# Patient Record
Sex: Female | Born: 1980 | Race: White | Hispanic: No | Marital: Single | Smoking: Current every day smoker
Health system: Southern US, Community
[De-identification: ages and names within clinical notes are randomized; demographics above are authoritative.]

## PROBLEM LIST (undated history)

## (undated) ENCOUNTER — Inpatient Hospital Stay (HOSPITAL_COMMUNITY): Payer: Self-pay

## (undated) DIAGNOSIS — I739 Peripheral vascular disease, unspecified: Secondary | ICD-10-CM

## (undated) DIAGNOSIS — B009 Herpesviral infection, unspecified: Secondary | ICD-10-CM

## (undated) DIAGNOSIS — Q279 Congenital malformation of peripheral vascular system, unspecified: Secondary | ICD-10-CM

## (undated) DIAGNOSIS — R87629 Unspecified abnormal cytological findings in specimens from vagina: Secondary | ICD-10-CM

## (undated) HISTORY — PX: APPENDECTOMY: SHX54

## (undated) HISTORY — PX: BREAST FIBROADENOMA SURGERY: SHX580

---
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
## 2017-09-23 ENCOUNTER — Encounter: Payer: Self-pay | Admitting: Family Medicine

## 2017-09-23 ENCOUNTER — Ambulatory Visit (INDEPENDENT_AMBULATORY_CARE_PROVIDER_SITE_OTHER): Payer: Self-pay | Admitting: *Deleted

## 2017-09-23 DIAGNOSIS — Z3201 Encounter for pregnancy test, result positive: Secondary | ICD-10-CM

## 2017-09-23 DIAGNOSIS — Z3200 Encounter for pregnancy test, result unknown: Secondary | ICD-10-CM

## 2017-09-23 LAB — POCT PREGNANCY, URINE: Preg Test, Ur: POSITIVE — AB

## 2017-09-23 MED ORDER — PRENATAL VITAMINS 0.8 MG PO TABS: 1.0000 | ORAL_TABLET | Freq: Every day | ORAL | 12 refills | Status: AC

## 2017-09-23 NOTE — Progress Notes (Signed)
Pt informed of +UPT today.  LMP 04/07/17.  EDD 01/12/18.  Medication reconciliation completed.  Pt is a new resident @ [facility]. She requests PNV Rx - sent.

## 2017-09-23 NOTE — Progress Notes (Signed)
Chart reviewed for nurse visit. Agree with plan of care.   Judeth HornLawrence, Murle Hellstrom, NP 09/23/2017 1:10 PM

## 2017-09-27 ENCOUNTER — Other Ambulatory Visit: Payer: Self-pay

## 2017-09-27 ENCOUNTER — Encounter (HOSPITAL_COMMUNITY): Payer: Self-pay | Admitting: *Deleted

## 2017-09-27 ENCOUNTER — Inpatient Hospital Stay (HOSPITAL_COMMUNITY)
Admission: AD | Admit: 2017-09-27 | Discharge: 2017-09-27 | Disposition: A | Discharge status: Home / Self Care | Payer: Medicaid Other | Source: Ambulatory Visit | Attending: Family Medicine | Admitting: Family Medicine

## 2017-09-27 DIAGNOSIS — Z87891 Personal history of nicotine dependence: Secondary | ICD-10-CM | POA: Diagnosis not present

## 2017-09-27 DIAGNOSIS — O26892 Other specified pregnancy related conditions, second trimester: Secondary | ICD-10-CM | POA: Insufficient documentation

## 2017-09-27 DIAGNOSIS — T148XXA Other injury of unspecified body region, initial encounter: Secondary | ICD-10-CM | POA: Diagnosis not present

## 2017-09-27 DIAGNOSIS — Z3A26 26 weeks gestation of pregnancy: Secondary | ICD-10-CM | POA: Diagnosis not present

## 2017-09-27 DIAGNOSIS — R109 Unspecified abdominal pain: Secondary | ICD-10-CM

## 2017-09-27 LAB — URINALYSIS, ROUTINE W REFLEX MICROSCOPIC
BACTERIA UA: NONE SEEN
Bilirubin Urine: NEGATIVE
Glucose, UA: NEGATIVE mg/dL
HGB URINE DIPSTICK: NEGATIVE
Ketones, ur: NEGATIVE mg/dL
LEUKOCYTES UA: NEGATIVE
NITRITE: NEGATIVE
PH: 6 (ref 5.0–8.0)
Protein, ur: NEGATIVE mg/dL
SPECIFIC GRAVITY, URINE: 1.018 (ref 1.005–1.030)

## 2017-09-27 LAB — RAPID URINE DRUG SCREEN, HOSP PERFORMED
AMPHETAMINES: NOT DETECTED
BARBITURATES: NOT DETECTED
Benzodiazepines: NOT DETECTED
Cocaine: NOT DETECTED
OPIATES: NOT DETECTED
TETRAHYDROCANNABINOL: NOT DETECTED

## 2017-09-27 MED ORDER — KETOROLAC TROMETHAMINE 60 MG/2ML IM SOLN
60.0000 mg | Freq: Once | INTRAMUSCULAR | Status: DC
  Filled 2017-09-27: qty 2

## 2017-09-27 MED ORDER — CYCLOBENZAPRINE HCL 10 MG PO TABS: 10.0000 mg | ORAL_TABLET | Freq: Three times a day (TID) | ORAL | 0 refills | Status: DC | PRN

## 2017-09-27 NOTE — MAU Note (Addendum)
Around 5 PM the pt reports pain that started suddenly. Pt decribes pain as a "tearing sensation" and then her "pelvis felt like it was on fire"

## 2017-09-27 NOTE — Discharge Instructions (Signed)
Abdominal Pain During Pregnancy Abdominal pain is common in pregnancy. Most of the time, it does not cause harm. There are many causes of abdominal pain. Some causes are more serious than others and sometimes the cause is not known. Abdominal pain can be a sign that something is very wrong with the pregnancy or the pain may have nothing to do with the pregnancy. Always tell your health care provider if you have any abdominal pain. Follow these instructions at home:  Do not have sex or put anything in your vagina until your symptoms go away completely.  Watch your abdominal pain for any changes.  Get plenty of rest until your pain improves.  Drink enough fluid to keep your urine clear or pale yellow.  Take over-the-counter or prescription medicines only as told by your health care provider.  Keep all follow-up visits as told by your health care provider. This is important. Contact a health care provider if:  You have a fever.  Your pain gets worse or you have cramping.  Your pain continues after resting. Get help right away if:  You are bleeding, leaking fluid, or passing tissue from the vagina.  You have vomiting or diarrhea that does not go away.  You have painful or bloody urination.  You notice a decrease in your baby's movements.  You feel very weak or faint.  You have shortness of breath.  You develop a severe headache with abdominal pain.  You have abnormal vaginal discharge with abdominal pain. This information is not intended to replace advice given to you by your health care provider. Make sure you discuss any questions you have with your health care provider. Document Released: 01/28/2005 Document Revised: 11/09/2015 Document Reviewed: 08/27/2012 Elsevier Interactive Patient Education  2018 Elsevier Inc.  Muscle Strain A muscle strain (pulled muscle) happens when a muscle is stretched beyond normal length. It happens when a sudden, violent force stretches your  muscle too far. Usually, a few of the fibers in your muscle are torn. Muscle strain is common in athletes. Recovery usually takes 1-2 weeks. Complete healing takes 5-6 weeks. Follow these instructions at home:  Follow the PRICE method of treatment to help your injury get better. Do this the first 2-3 days after the injury: ? Protect. Protect the muscle to keep it from getting injured again. ? Rest. Limit your activity and rest the injured body part. ? Ice. Put ice in a plastic bag. Place a towel between your skin and the bag. Then, apply the ice and leave it on from 15-20 minutes each hour. After the third day, switch to moist heat packs. ? Compression. Use a splint or elastic bandage on the injured area for comfort. Do not put it on too tightly. ? Elevate. Keep the injured body part above the level of your heart.  Only take medicine as told by your doctor.  Warm up before doing exercise to prevent future muscle strains. Contact a doctor if:  You have more pain or puffiness (swelling) in the injured area.  You feel numbness, tingling, or notice a loss of strength in the injured area. This information is not intended to replace advice given to you by your health care provider. Make sure you discuss any questions you have with your health care provider. Document Released: 11/07/2007 Document Revised: 07/06/2015 Document Reviewed: 08/27/2012 Elsevier Interactive Patient Education  2017 [employer]

## 2017-09-27 NOTE — MAU Provider Note (Signed)
History   G3 P1-1-0-2 at 26 weeks and 2 days per LMP and with acute left-sided tearing pain that started at 5 PM.  She denies vaginal bleeding or rupture membranes.  Patient said she had an ultrasound done at at [facility] [place] [facility] [place] and was told she has an EDC of 01/01/2018.  Patient has not had prenatal care this pregnancy.  Patient states pain is a tearing pain she feels like going through her colon.  CSN: [id number]  Arrival date & time 09/27/17  1919   None     Chief Complaint  Patient presents with  . Rectal Pain  . Abdominal Pain    HPI  Past Medical History:  Diagnosis Date  . Medical history non-contributory     Past Surgical History:  Procedure Laterality Date  . APPENDECTOMY      No family history on file.  Social History   Tobacco Use  . Smoking status: Former Smoker    Last attempt to quit: 08/27/2017    Years since quitting: 0.0  . Smokeless tobacco: Never Used  Substance Use Topics  . Alcohol use: Never    Frequency: Never  . Drug use: Never    OB History    Gravida  3   Para  2   Term      Preterm  2   AB      Living  2     SAB      TAB      Ectopic      Multiple      Live Births  2           Review of Systems  Constitutional: Negative.   HENT: Negative.   Eyes: Negative.   Respiratory: Negative.   Cardiovascular: Negative.   Gastrointestinal: Positive for abdominal pain.  Endocrine: Negative.   Genitourinary: Negative.   Musculoskeletal: Negative.   Skin: Negative.   Allergic/Immunologic: Negative.   Neurological: Negative.   Hematological: Negative.   Psychiatric/Behavioral: Negative.     Allergies  Patient has no known allergies.  Home Medications    BP 114/66 (BP Location: Right Arm)   Pulse (!) 109   Temp 98.1 F (36.7 C)   Resp 20   Ht 5\' 6"  (1.676 m)   Wt 70.8 kg   BMI 25.18 kg/m   Physical Exam  Constitutional: She is oriented to person, place, and time. She  appears well-developed and well-nourished.  HENT:  Head: Normocephalic and atraumatic.  Cardiovascular: Normal rate, regular rhythm and normal heart sounds.  Pulmonary/Chest: Effort normal and breath sounds normal.  Abdominal: Soft. Normal appearance. Bowel sounds are increased.  Genitourinary: Rectum normal, vagina normal and uterus normal.  Neurological: She is alert and oriented to person, place, and time.  Skin: Skin is warm and dry.  Psychiatric: She has a normal mood and affect. Her behavior is normal.    MAU Course  Procedures (including critical care time)  Labs Reviewed  URINALYSIS, ROUTINE W REFLEX MICROSCOPIC  RAPID URINE DRUG SCREEN, HOSP PERFORMED  HEPATITIS B SURFACE ANTIGEN  RUBELLA SCREEN  RPR  CBC  DIFFERENTIAL  HIV ANTIBODY (ROUTINE TESTING)  HEMOGLOBIN A1C  TYPE AND SCREEN   No results found.   No diagnosis found.    MDM  Vital signs are stable.  Fetal heart rate is 150s moderate variability no decelerations.  There are no uterine contractions noted.  Abdomen is soft and nontender.  Sterile vaginal exam was performed cervix  is fine thick posterior and high. Will d/c home with rx for flexiril

## 2017-09-27 NOTE — MAU Note (Addendum)
Sudden onset tearing pain in lower abd that went to rectum and lower back. No vag bleeding or d/c. Normal BM today. Pt lives in maternity home here and has had care in [place]. Has appt downstairs in clinic 10/07/17

## 2017-10-07 ENCOUNTER — Encounter: Payer: Self-pay | Admitting: Family Medicine

## 2017-10-07 ENCOUNTER — Ambulatory Visit (INDEPENDENT_AMBULATORY_CARE_PROVIDER_SITE_OTHER): Payer: Medicaid Other | Admitting: Advanced Practice Midwife

## 2017-10-07 ENCOUNTER — Ambulatory Visit (INDEPENDENT_AMBULATORY_CARE_PROVIDER_SITE_OTHER): Payer: Medicaid Other | Admitting: Clinical

## 2017-10-07 ENCOUNTER — Other Ambulatory Visit: Payer: Self-pay | Admitting: Advanced Practice Midwife

## 2017-10-07 VITALS — BP 109/69 | HR 93 | Wt 157.2 lb

## 2017-10-07 DIAGNOSIS — Z3482 Encounter for supervision of other normal pregnancy, second trimester: Secondary | ICD-10-CM | POA: Diagnosis present

## 2017-10-07 DIAGNOSIS — Z609 Problem related to social environment, unspecified: Secondary | ICD-10-CM

## 2017-10-07 DIAGNOSIS — Z3480 Encounter for supervision of other normal pregnancy, unspecified trimester: Secondary | ICD-10-CM | POA: Insufficient documentation

## 2017-10-07 DIAGNOSIS — R102 Pelvic and perineal pain: Secondary | ICD-10-CM | POA: Diagnosis not present

## 2017-10-07 DIAGNOSIS — R12 Heartburn: Secondary | ICD-10-CM

## 2017-10-07 DIAGNOSIS — O26899 Other specified pregnancy related conditions, unspecified trimester: Secondary | ICD-10-CM

## 2017-10-07 DIAGNOSIS — O26893 Other specified pregnancy related conditions, third trimester: Secondary | ICD-10-CM

## 2017-10-07 DIAGNOSIS — Q279 Congenital malformation of peripheral vascular system, unspecified: Secondary | ICD-10-CM

## 2017-10-07 DIAGNOSIS — Z23 Encounter for immunization: Secondary | ICD-10-CM | POA: Diagnosis not present

## 2017-10-07 DIAGNOSIS — I739 Peripheral vascular disease, unspecified: Secondary | ICD-10-CM | POA: Insufficient documentation

## 2017-10-07 LAB — POCT URINALYSIS DIP (DEVICE)
Bilirubin Urine: NEGATIVE
Glucose, UA: NEGATIVE mg/dL
HGB URINE DIPSTICK: NEGATIVE
Ketones, ur: NEGATIVE mg/dL
NITRITE: NEGATIVE
PH: 7.5 (ref 5.0–8.0)
Protein, ur: NEGATIVE mg/dL
Specific Gravity, Urine: 1.015 (ref 1.005–1.030)
UROBILINOGEN UA: 0.2 mg/dL (ref 0.0–1.0)

## 2017-10-07 MED ORDER — TRAMADOL HCL 50 MG PO TABS: 50.0000 mg | ORAL_TABLET | Freq: Four times a day (QID) | ORAL | 0 refills | Status: DC | PRN

## 2017-10-07 MED ORDER — RANITIDINE HCL 150 MG PO TABS: 150.0000 mg | ORAL_TABLET | Freq: Two times a day (BID) | ORAL | 5 refills | Status: DC

## 2017-10-07 MED ORDER — COMFORT FIT MATERNITY SUPP MED MISC
1.0000 | Freq: Every day | 0 refills | Status: AC
Start: 2017-10-07 — End: ?

## 2017-10-07 NOTE — Patient Instructions (Signed)
Third Trimester of Pregnancy The third trimester is from week 28 through week 40 (months 7 through 9). The third trimester is a time when the unborn baby (fetus) is growing rapidly. At the end of the ninth month, the fetus is about 20 inches in length and weighs 6-10 pounds. Body changes during your third trimester Your body will continue to go through many changes during pregnancy. The changes vary from woman to woman. During the third trimester:  Your weight will continue to increase. You can expect to gain 25-35 pounds (11-16 kg) by the end of the pregnancy.  You may begin to get stretch marks on your hips, abdomen, and breasts.  You may urinate more often because the fetus is moving lower into your pelvis and pressing on your bladder.  You may develop or continue to have heartburn. This is caused by increased hormones that slow down muscles in the digestive tract.  You may develop or continue to have constipation because increased hormones slow digestion and cause the muscles that push waste through your intestines to relax.  You may develop hemorrhoids. These are swollen veins (varicose veins) in the rectum that can itch or be painful.  You may develop swollen, bulging veins (varicose veins) in your legs.  You may have increased body aches in the pelvis, back, or thighs. This is due to weight gain and increased hormones that are relaxing your joints.  You may have changes in your hair. These can include thickening of your hair, rapid growth, and changes in texture. Some women also have hair loss during or after pregnancy, or hair that feels dry or thin. Your hair will most likely return to normal after your baby is born.  Your breasts will continue to grow and they will continue to become tender. A yellow fluid (colostrum) may leak from your breasts. This is the first milk you are producing for your baby.  Your belly button may stick out.  You may notice more swelling in your hands,  face, or ankles.  You may have increased tingling or numbness in your hands, arms, and legs. The skin on your belly may also feel numb.  You may feel short of breath because of your expanding uterus.  You may have more problems sleeping. This can be caused by the size of your belly, increased need to urinate, and an increase in your body's metabolism.  You may notice the fetus "dropping," or moving lower in your abdomen (lightening).  You may have increased vaginal discharge.  You may notice your joints feel loose and you may have pain around your pelvic bone.  What to expect at prenatal visits You will have prenatal exams every 2 weeks until week 36. Then you will have weekly prenatal exams. During a routine prenatal visit:  You will be weighed to make sure you and the baby are growing normally.  Your blood pressure will be taken.  Your abdomen will be measured to track your baby's growth.  The fetal heartbeat will be listened to.  Any test results from the previous visit will be discussed.  You may have a cervical check near your due date to see if your cervix has softened or thinned (effaced).  You will be tested for Group B streptococcus. This happens between 35 and 37 weeks.  Your health care provider may ask you:  What your birth plan is.  How you are feeling.  If you are feeling the baby move.  If you have had   any abnormal symptoms, such as leaking fluid, bleeding, severe headaches, or abdominal cramping.  If you are using any tobacco products, including cigarettes, chewing tobacco, and electronic cigarettes.  If you have any questions.  Other tests or screenings that may be performed during your third trimester include:  Blood tests that check for low iron levels (anemia).  Fetal testing to check the health, activity level, and growth of the fetus. Testing is done if you have certain medical conditions or if there are problems during the  pregnancy.  Nonstress test (NST). This test checks the health of your baby to make sure there are no signs of problems, such as the baby not getting enough oxygen. During this test, a belt is placed around your belly. The baby is made to move, and its heart rate is monitored during movement.  What is false labor? False labor is a condition in which you feel small, irregular tightenings of the muscles in the womb (contractions) that usually go away with rest, changing position, or drinking water. These are called Braxton Hicks contractions. Contractions may last for hours, days, or even weeks before true labor sets in. If contractions come at regular intervals, become more frequent, increase in intensity, or become painful, you should see your health care provider. What are the signs of labor?  Abdominal cramps.  Regular contractions that start at 10 minutes apart and become stronger and more frequent with time.  Contractions that start on the top of the uterus and spread down to the lower abdomen and back.  Increased pelvic pressure and dull back pain.  A watery or bloody mucus discharge that comes from the vagina.  Leaking of amniotic fluid. This is also known as your "water breaking." It could be a slow trickle or a gush. Let your health care provider know if it has a color or strange odor. If you have any of these signs, call your health care provider right away, even if it is before your due date. Follow these instructions at home: Medicines  Follow your health care provider's instructions regarding medicine use. Specific medicines may be either safe or unsafe to take during pregnancy.  Take a prenatal vitamin that contains at least 600 micrograms (mcg) of folic acid.  If you develop constipation, try taking a stool softener if your health care provider approves. Eating and drinking  Eat a balanced diet that includes fresh fruits and vegetables, whole grains, good sources of protein  such as meat, eggs, or tofu, and low-fat dairy. Your health care provider will help you determine the amount of weight gain that is right for you.  Avoid raw meat and uncooked cheese. These carry germs that can cause birth defects in the baby.  If you have low calcium intake from food, talk to your health care provider about whether you should take a daily calcium supplement.  Eat four or five small meals rather than three large meals a day.  Limit foods that are high in fat and processed sugars, such as fried and sweet foods.  To prevent constipation: ? Drink enough fluid to keep your urine clear or pale yellow. ? Eat foods that are high in fiber, such as fresh fruits and vegetables, whole grains, and beans. Activity  Exercise only as directed by your health care provider. Most women can continue their usual exercise routine during pregnancy. Try to exercise for 30 minutes at least 5 days a week. Stop exercising if you experience uterine contractions.  Avoid heavy   lifting.  Do not exercise in extreme heat or humidity, or at high altitudes.  Wear low-heel, comfortable shoes.  Practice good posture.  You may continue to have sex unless your health care provider tells you otherwise. Relieving pain and discomfort  Take frequent breaks and rest with your legs elevated if you have leg cramps or low back pain.  Take warm sitz baths to soothe any pain or discomfort caused by hemorrhoids. Use hemorrhoid cream if your health care provider approves.  Wear a good support bra to prevent discomfort from breast tenderness.  If you develop varicose veins: ? Wear support pantyhose or compression stockings as told by your healthcare provider. ? Elevate your feet for 15 minutes, 3-4 times a day. Prenatal care  Write down your questions. Take them to your prenatal visits.  Keep all your prenatal visits as told by your health care provider. This is important. Safety  Wear your seat belt at  all times when driving.  Make a list of emergency phone numbers, including numbers for family, friends, the hospital, and police and fire departments. General instructions  Avoid cat litter boxes and soil used by cats. These carry germs that can cause birth defects in the baby. If you have a cat, ask someone to clean the litter box for you.  Do not travel far distances unless it is absolutely necessary and only with the approval of your health care provider.  Do not use hot tubs, steam rooms, or saunas.  Do not drink alcohol.  Do not use any products that contain nicotine or tobacco, such as cigarettes and e-cigarettes. If you need help quitting, ask your health care provider.  Do not use any medicinal herbs or unprescribed drugs. These chemicals affect the formation and growth of the baby.  Do not douche or use tampons or scented sanitary pads.  Do not cross your legs for long periods of time.  To prepare for the arrival of your baby: ? Take prenatal classes to understand, practice, and ask questions about labor and delivery. ? Make a trial run to the hospital. ? Visit the hospital and tour the maternity area. ? Arrange for maternity or paternity leave through employers. ? Arrange for family and friends to take care of pets while you are in the hospital. ? Purchase a rear-facing car seat and make sure you know how to install it in your car. ? Pack your hospital bag. ? Prepare the baby's nursery. Make sure to remove all pillows and stuffed animals from the baby's crib to prevent suffocation.  Visit your dentist if you have not gone during your pregnancy. Use a soft toothbrush to brush your teeth and be gentle when you floss. Contact a health care provider if:  You are unsure if you are in labor or if your water has broken.  You become dizzy.  You have mild pelvic cramps, pelvic pressure, or nagging pain in your abdominal area.  You have lower back pain.  You have persistent  nausea, vomiting, or diarrhea.  You have an unusual or bad smelling vaginal discharge.  You have pain when you urinate. Get help right away if:  Your water breaks before 37 weeks.  You have regular contractions less than 5 minutes apart before 37 weeks.  You have a fever.  You are leaking fluid from your vagina.  You have spotting or bleeding from your vagina.  You have severe abdominal pain or cramping.  You have rapid weight loss or weight gain.    You have shortness of breath with chest pain.  You notice sudden or extreme swelling of your face, hands, ankles, feet, or legs.  Your baby makes fewer than 10 movements in 2 hours.  You have severe headaches that do not go away when you take medicine.  You have vision changes. Summary  The third trimester is from week 28 through week 40, months 7 through 9. The third trimester is a time when the unborn baby (fetus) is growing rapidly.  During the third trimester, your discomfort may increase as you and your baby continue to gain weight. You may have abdominal, leg, and back pain, sleeping problems, and an increased need to urinate.  During the third trimester your breasts will keep growing and they will continue to become tender. A yellow fluid (colostrum) may leak from your breasts. This is the first milk you are producing for your baby.  False labor is a condition in which you feel small, irregular tightenings of the muscles in the womb (contractions) that eventually go away. These are called Braxton Hicks contractions. Contractions may last for hours, days, or even weeks before true labor sets in.  Signs of labor can include: abdominal cramps; regular contractions that start at 10 minutes apart and become stronger and more frequent with time; watery or bloody mucus discharge that comes from the vagina; increased pelvic pressure and dull back pain; and leaking of amniotic fluid. This information is not intended to replace advice  given to you by your health care provider. Make sure you discuss any questions you have with your health care provider. Document Released: 01/22/2001 Document Revised: 07/06/2015 Document Reviewed: 03/31/2012 Elsevier Interactive Patient Education  2017 Elsevier Inc.  
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
## 2017-10-07 NOTE — Progress Notes (Signed)
Rx for heartburn. States she's using Tums OTC.
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
## 2017-10-09 LAB — URINE CULTURE, OB REFLEX: Organism ID, Bacteria: NO GROWTH

## 2017-10-09 LAB — CULTURE, OB URINE

## 2017-10-10 ENCOUNTER — Encounter: Payer: Self-pay | Admitting: Advanced Practice Midwife

## 2017-10-17 ENCOUNTER — Encounter: Payer: Self-pay | Admitting: *Deleted

## 2017-10-17 LAB — OBSTETRIC PANEL, INCLUDING HIV
Antibody Screen: NEGATIVE
BASOS ABS: 0 10*3/uL (ref 0.0–0.2)
Basos: 0 %
EOS (ABSOLUTE): 0.1 10*3/uL (ref 0.0–0.4)
Eos: 2 %
HEP B S AG: NEGATIVE
HIV Screen 4th Generation wRfx: NONREACTIVE
Hematocrit: 35 % (ref 34.0–46.6)
Hemoglobin: 11.8 g/dL (ref 11.1–15.9)
IMMATURE GRANS (ABS): 0 10*3/uL (ref 0.0–0.1)
IMMATURE GRANULOCYTES: 0 %
LYMPHS ABS: 2.1 10*3/uL (ref 0.7–3.1)
LYMPHS: 22 %
MCH: 30.3 pg (ref 26.6–33.0)
MCHC: 33.7 g/dL (ref 31.5–35.7)
MCV: 90 fL (ref 79–97)
MONOS ABS: 0.6 10*3/uL (ref 0.1–0.9)
Monocytes: 6 %
NEUTROS PCT: 70 %
Neutrophils Absolute: 6.4 10*3/uL (ref 1.4–7.0)
Platelets: 264 10*3/uL (ref 150–450)
RBC: 3.9 x10E6/uL (ref 3.77–5.28)
RDW: 13.8 % (ref 12.3–15.4)
RPR: NONREACTIVE
Rh Factor: POSITIVE
Rubella Antibodies, IGG: 5.95 index (ref >0.99)
WBC: 9.3 10*3/uL (ref 3.4–10.8)

## 2017-10-17 LAB — SMN1 COPY NUMBER ANALYSIS (SMA CARRIER SCREENING)

## 2017-10-17 LAB — HEMOGLOBINOPATHY EVALUATION
FERRITIN: 15 ng/mL (ref 15–150)
HGB C: 0 %
HGB F QUANT: 0 % (ref 0.0–2.0)
HGB SOLUBILITY: NEGATIVE
Hgb A2 Quant: 2.2 % (ref 1.8–3.2)
Hgb A: 97.8 % (ref 96.4–98.8)
Hgb S: 0 %
Hgb Variant: 0 %

## 2017-10-17 LAB — CYSTIC FIBROSIS GENE TEST

## 2017-10-20 ENCOUNTER — Encounter: Payer: Self-pay | Admitting: *Deleted

## 2017-10-21 ENCOUNTER — Ambulatory Visit (INDEPENDENT_AMBULATORY_CARE_PROVIDER_SITE_OTHER): Payer: Medicaid Other | Admitting: Student

## 2017-10-21 ENCOUNTER — Encounter: Payer: Self-pay | Admitting: Obstetrics and Gynecology

## 2017-10-21 DIAGNOSIS — Q279 Congenital malformation of peripheral vascular system, unspecified: Secondary | ICD-10-CM

## 2017-10-21 DIAGNOSIS — Z3483 Encounter for supervision of other normal pregnancy, third trimester: Secondary | ICD-10-CM

## 2017-10-21 DIAGNOSIS — Z3480 Encounter for supervision of other normal pregnancy, unspecified trimester: Secondary | ICD-10-CM

## 2017-10-21 MED ORDER — CYCLOBENZAPRINE HCL 10 MG PO TABS: 10.0000 mg | ORAL_TABLET | Freq: Three times a day (TID) | ORAL | 0 refills | Status: AC | PRN

## 2017-10-21 MED ORDER — TRAMADOL HCL 50 MG PO TABS: 50.0000 mg | ORAL_TABLET | Freq: Four times a day (QID) | ORAL | 0 refills | Status: DC | PRN

## 2017-10-21 MED ORDER — OMEPRAZOLE 20 MG PO TBEC: 1.0000 | DELAYED_RELEASE_TABLET | Freq: Every day | ORAL | 2 refills | Status: AC

## 2017-10-21 NOTE — Patient Instructions (Signed)
Heartburn During Pregnancy Heartburn is pain or discomfort in the throat or chest. It may cause a burning feeling. It happens when stomach acid moves up into the tube that carries food from your mouth to your stomach (esophagus). Heartburn is common during pregnancy. It usually goes away or gets better after giving birth. Follow these instructions at home: Eating and drinking  Do not drink alcohol while you are pregnant.  Figure out which foods and beverages make you feel worse, and avoid them.  Beverages that you may want to avoid include: ? Coffee and tea (with or without caffeine). ? Energy drinks and sports drinks. ? Bubbly (carbonated) drinks or sodas. ? Citrus fruit juices.  Foods that you may want to avoid include: ? Chocolate and cocoa. ? Peppermint and mint flavorings. ? Garlic, onions, and horseradish. ? Spicy and acidic foods. These include peppers, chili powder, curry powder, vinegar, hot sauces, and barbecue sauce. ? Citrus fruits, such as oranges, lemons, and limes. ? Tomato-based foods, such as red sauce, chili, and salsa. ? Fried and fatty foods, such as donuts, french fries, potato chips, and high-fat dressings. ? High-fat meats, such as hot dogs, cold cuts, sausage, ham, and bacon. ? High-fat dairy items, such as whole milk, butter, and cheese.  Eat small meals often, instead of large meals.  Avoid drinking a lot of liquid with your meals.  Avoid eating meals during the 2-3 hours before you go to bed.  Avoid lying down right after you eat.  Do not exercise right after you eat. Medicines  Take over-the-counter and prescription medicines only as told by your doctor.  Do not take aspirin, ibuprofen, or other NSAIDs unless your doctor tells you to do that.  Your doctor may tell you to avoid medicines that have sodium bicarbonate in them. General instructions  If told, raise the head of your bed about 6 inches (15 cm). You can do this by putting blocks under  the legs. Sleeping with more pillows does not help with heartburn.  Do not use any products that contain nicotine or tobacco, such as cigarettes and e-cigarettes. If you need help quitting, ask your doctor.  Wear loose-fitting clothing.  Try to lower your stress, such as with yoga or meditation. If you need help, ask your doctor.  Stay at a healthy weight. If you are overweight, work with your doctor to safely lose weight.  Keep all follow-up visits as told by your doctor. This is important. Contact a doctor if:  You get new symptoms.  Your symptoms do not get better with treatment.  You have weight loss and you do not know why.  You have trouble swallowing.  You make loud sounds when you breathe (wheeze).  You have a cough that does not go away.  You have heartburn often for more than 2 weeks.  You feel sick to your stomach (nauseous), and this does not get better with treatment.  You are throwing up (vomiting), and this does not get better with treatment.  You have pain in your belly (abdomen). Get help right away if:  You have very bad chest pain that spreads to your arm, neck, or jaw.  You feel sweaty, dizzy, or light-headed.  You have trouble breathing.  You have pain when swallowing.  You throw up and your throw-up looks like blood or coffee grounds.  Your poop (stool) is bloody or black. This information is not intended to replace advice given to you by your health care provider.   Make sure you discuss any questions you have with your health care provider. Document Released: 03/02/2010 Document Revised: 10/16/2015 Document Reviewed: 10/16/2015 Elsevier Interactive Patient Education  2017 Elsevier Inc.  

## 2017-10-21 NOTE — Progress Notes (Signed)
   PRENATAL VISIT NOTE  Subjective:  Gabrielle Smith is a 37 y.o. [phone] at [redacted]w[redacted]d being seen today for ongoing prenatal care.  She is currently monitored for the following issues for this low-risk pregnancy and has Supervision of other normal pregnancy, antepartum; High risk social situation; Congenital vascular malformation; and Peripheral vascular disease of foot (HCC) on their problem list.  Patient reports no complaints.  Contractions: Not present. Vag. Bleeding: None.  Movement: Present. Denies leaking of fluid.   The following portions of the patient's history were reviewed and updated as appropriate: allergies, current medications, past family history, past medical history, past social history, past surgical history and problem list. Problem list updated.  Objective:   Vitals:   10/21/17 1405  BP: (!) 100/54  Pulse: 90  Weight: 162 lb 11.2 oz (73.8 kg)    Fetal Status: Fetal Heart Rate (bpm): 144 Fundal Height: 29 cm Movement: Present     General:  Alert, oriented and cooperative. Patient is in no acute distress.  Skin: Skin is warm and dry. No rash noted.   Cardiovascular: Normal heart rate noted  Respiratory: Normal respiratory effort, no problems with respiration noted  Abdomen: Soft, gravid, appropriate for gestational age.  Pain/Pressure: Present     Pelvic: Cervical exam deferred        Extremities: Normal range of motion.  Edema: Trace  Mental Status: Normal mood and affect. Normal behavior. Normal judgment and thought content.   Assessment and Plan:  Pregnancy: [id number] at [redacted]w[redacted]d  1. Supervision of other normal pregnancy, antepartum -Will do 1 hour today.  - [place] MFM OB DETAIL +14 WK; Future -Coping well; see her therapist. Declines to see Asher Muir today.  -Changed from Zantac to Omeprazole as patient states it worked for her last time and she feels her heartburn is getting worse.   2. Congenital vascular malformation Will see vascular this Friday, refill on Flexeril  and Tramadol.   - traMADol (ULTRAM) 50 MG tablet; Take 1 tablet (50 mg total) by mouth every 6 (six) hours as needed.  Dispense: 20 tablet; Refill: 0   Preterm labor symptoms and general obstetric precautions including but not limited to vaginal bleeding, contractions, leaking of fluid and fetal movement were reviewed in detail with the patient. Please refer to After Visit Summary for other counseling recommendations.  Return in about 2 weeks (around 11/04/2017).  Future Appointments  Date Time Provider Department Center  10/30/2017  7:45 AM [facility] [place] 2 [facility]    Marylene Land, CNM

## 2017-10-21 NOTE — Addendum Note (Signed)
Addended by: Chrystie Nose on: 10/21/2017 03:15 PM   Modules accepted: Orders

## 2017-10-22 LAB — GLUCOSE TOLERANCE, 1 HOUR: GLUCOSE, 1HR PP: 121 mg/dL (ref 65–199)

## 2017-10-24 ENCOUNTER — Encounter (HOSPITAL_COMMUNITY): Payer: Self-pay

## 2017-10-30 ENCOUNTER — Ambulatory Visit (HOSPITAL_COMMUNITY): Admission: RE | Admit: 2017-10-30 | Payer: Medicaid Other | Source: Ambulatory Visit

## 2017-10-31 ENCOUNTER — Encounter: Payer: Self-pay | Admitting: *Deleted

## 2017-11-03 ENCOUNTER — Encounter: Payer: Self-pay | Admitting: *Deleted

## 2017-11-04 ENCOUNTER — Encounter: Payer: Self-pay | Admitting: Family Medicine

## 2017-11-04 ENCOUNTER — Encounter: Payer: Self-pay | Admitting: Student

## 2017-11-05 ENCOUNTER — Ambulatory Visit (HOSPITAL_COMMUNITY)
Admission: RE | Admit: 2017-11-05 | Discharge: 2017-11-05 | Disposition: A | Discharge status: Home / Self Care | Payer: Medicaid Other | Source: Ambulatory Visit | Attending: Student | Admitting: Student

## 2017-11-05 ENCOUNTER — Encounter (HOSPITAL_COMMUNITY): Payer: Self-pay

## 2017-11-05 DIAGNOSIS — O09523 Supervision of elderly multigravida, third trimester: Secondary | ICD-10-CM | POA: Diagnosis present

## 2017-11-05 DIAGNOSIS — Z3A32 32 weeks gestation of pregnancy: Secondary | ICD-10-CM

## 2017-11-05 DIAGNOSIS — Z363 Encounter for antenatal screening for malformations: Secondary | ICD-10-CM | POA: Insufficient documentation

## 2017-11-05 DIAGNOSIS — Z3480 Encounter for supervision of other normal pregnancy, unspecified trimester: Secondary | ICD-10-CM

## 2017-11-05 HISTORY — DX: Congenital malformation of peripheral vascular system, unspecified: Q27.9

## 2017-11-05 HISTORY — DX: Herpesviral infection, unspecified: B00.9

## 2017-11-05 HISTORY — DX: Unspecified abnormal cytological findings in specimens from vagina: R87.629

## 2017-11-05 HISTORY — DX: Peripheral vascular disease, unspecified: I73.9

## 2017-11-06 ENCOUNTER — Encounter: Payer: Self-pay | Admitting: Student

## 2017-11-06 ENCOUNTER — Other Ambulatory Visit (HOSPITAL_COMMUNITY): Payer: Self-pay | Admitting: *Deleted

## 2017-11-06 DIAGNOSIS — Q040 Congenital malformations of corpus callosum: Secondary | ICD-10-CM | POA: Insufficient documentation

## 2017-11-06 DIAGNOSIS — O359XX0 Maternal care for (suspected) fetal abnormality and damage, unspecified, not applicable or unspecified: Secondary | ICD-10-CM

## 2017-11-07 ENCOUNTER — Ambulatory Visit (INDEPENDENT_AMBULATORY_CARE_PROVIDER_SITE_OTHER): Payer: Medicaid Other

## 2017-11-07 VITALS — BP 118/73 | HR 101 | Wt 167.4 lb

## 2017-11-07 DIAGNOSIS — O98513 Other viral diseases complicating pregnancy, third trimester: Secondary | ICD-10-CM

## 2017-11-07 DIAGNOSIS — B009 Herpesviral infection, unspecified: Secondary | ICD-10-CM

## 2017-11-07 DIAGNOSIS — Z3480 Encounter for supervision of other normal pregnancy, unspecified trimester: Secondary | ICD-10-CM

## 2017-11-07 MED ORDER — VALACYCLOVIR HCL 500 MG PO TABS: 500.0000 mg | ORAL_TABLET | Freq: Two times a day (BID) | ORAL | 0 refills | Status: AC

## 2017-11-07 NOTE — Progress Notes (Signed)
   PRENATAL VISIT NOTE  Subjective:  Gabrielle Smith is a 37 y.o. [phone] at [redacted]w[redacted]d being seen today for ongoing prenatal care.  She is currently monitored for the following issues for this low-risk pregnancy and has Supervision of other normal pregnancy, antepartum; High risk social situation; Congenital vascular malformation; Peripheral vascular disease of foot (HCC); and Agenesis of corpus callosum (HCC) on their problem list.  Patient reports active HSV outbreak.  Contractions: Irritability. Vag. Bleeding: None.  Movement: Present. Denies leaking of fluid.   The following portions of the patient's history were reviewed and updated as appropriate: allergies, current medications, past family history, past medical history, past social history, past surgical history and problem list. Problem list updated.  Objective:   Vitals:   11/07/17 1327  BP: 118/73  Pulse: (!) 101  Weight: 167 lb 6.4 oz (75.9 kg)    Fetal Status: Fetal Heart Rate (bpm): 120 Fundal Height: 32 cm Movement: Present     General:  Alert, oriented and cooperative. Patient is in no acute distress.  Skin: Skin is warm and dry. No rash noted.   Cardiovascular: Normal heart rate noted  Respiratory: Normal respiratory effort, no problems with respiration noted  Abdomen: Soft, gravid, appropriate for gestational age.  Pain/Pressure: Present     Pelvic: Cervical exam deferred        Extremities: Normal range of motion.  Edema: None  Mental Status: Normal mood and affect. Normal behavior. Normal judgment and thought content.   Assessment and Plan:  Pregnancy: [id number] at [redacted]w[redacted]d  1. Supervision of other normal pregnancy, antepartum - No complaints. Routine care - Patient waiting for fetal MRI and has decided she wants amniocentesis. Will call MFM and get procedure scheduled.  2. HSV-2 infection complicating pregnancy, third trimester - Current HSV prodromal symptoms, no active outbreak seen, will start treatment and recheck  at next visit.  - valACYclovir (VALTREX) 500 MG tablet; Take 1 tablet (500 mg total) by mouth 2 (two) times daily.  Dispense: 60 tablet; Refill: 0  Preterm labor symptoms and general obstetric precautions including but not limited to vaginal bleeding, contractions, leaking of fluid and fetal movement were reviewed in detail with the patient. Please refer to After Visit Summary for other counseling recommendations.  Return in about 2 weeks (around 11/21/2017) for Return OB visit.  Future Appointments  Date Time Provider Department Center  11/07/2017  1:35 PM Rennie Plowman [facility]  12/04/2017 10:00 AM [facility] [place] 3 [facility]    Rolm Bookbinder, [place] 11/07/17 1:35 PM

## 2017-11-10 ENCOUNTER — Other Ambulatory Visit (HOSPITAL_COMMUNITY): Payer: Self-pay | Admitting: *Deleted

## 2017-11-10 DIAGNOSIS — O359XX0 Maternal care for (suspected) fetal abnormality and damage, unspecified, not applicable or unspecified: Secondary | ICD-10-CM

## 2017-11-11 ENCOUNTER — Other Ambulatory Visit (HOSPITAL_COMMUNITY): Payer: Self-pay | Admitting: *Deleted

## 2017-11-11 ENCOUNTER — Other Ambulatory Visit (HOSPITAL_COMMUNITY): Payer: Self-pay | Admitting: Obstetrics and Gynecology

## 2017-11-11 ENCOUNTER — Ambulatory Visit (HOSPITAL_COMMUNITY)
Admission: RE | Admit: 2017-11-11 | Discharge: 2017-11-11 | Disposition: A | Discharge status: Home / Self Care | Payer: Medicaid Other | Source: Ambulatory Visit | Attending: Student | Admitting: Student

## 2017-11-11 ENCOUNTER — Other Ambulatory Visit (HOSPITAL_COMMUNITY): Payer: Self-pay

## 2017-11-11 ENCOUNTER — Encounter (HOSPITAL_COMMUNITY): Payer: Self-pay

## 2017-11-11 VITALS — BP 105/64 | HR 105 | Wt 165.4 lb

## 2017-11-11 DIAGNOSIS — Q040 Congenital malformations of corpus callosum: Secondary | ICD-10-CM

## 2017-11-11 DIAGNOSIS — O288 Other abnormal findings on antenatal screening of mother: Secondary | ICD-10-CM

## 2017-11-11 DIAGNOSIS — O359XX0 Maternal care for (suspected) fetal abnormality and damage, unspecified, not applicable or unspecified: Secondary | ICD-10-CM | POA: Diagnosis not present

## 2017-11-11 DIAGNOSIS — Z3A33 33 weeks gestation of pregnancy: Secondary | ICD-10-CM | POA: Insufficient documentation

## 2017-11-11 DIAGNOSIS — O09523 Supervision of elderly multigravida, third trimester: Secondary | ICD-10-CM | POA: Diagnosis not present

## 2017-11-11 DIAGNOSIS — O358XX0 Maternal care for other (suspected) fetal abnormality and damage, not applicable or unspecified: Secondary | ICD-10-CM | POA: Diagnosis present

## 2017-11-11 DIAGNOSIS — Z362 Encounter for other antenatal screening follow-up: Secondary | ICD-10-CM

## 2017-11-11 IMAGING — US US MFM AMNIOCENTESIS
1 series · 13 of 28 positions shown · non-contrast
Comparison: none

[Series 1: us mfm amniocentesis · 28 acquisitions, 13 frames shown]
[im 2/28]
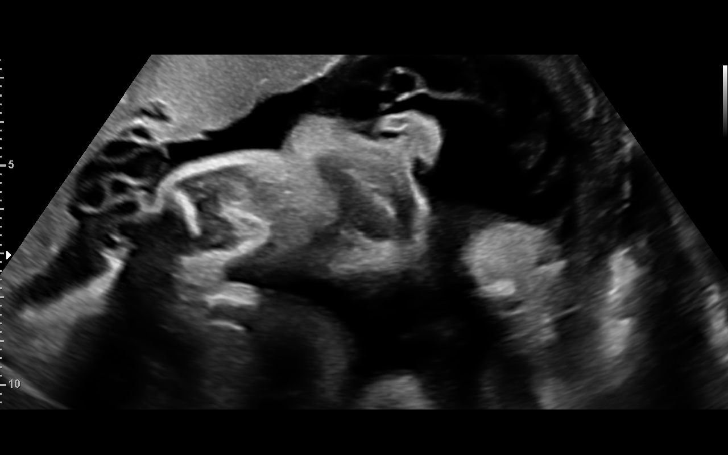
[im 4/28]
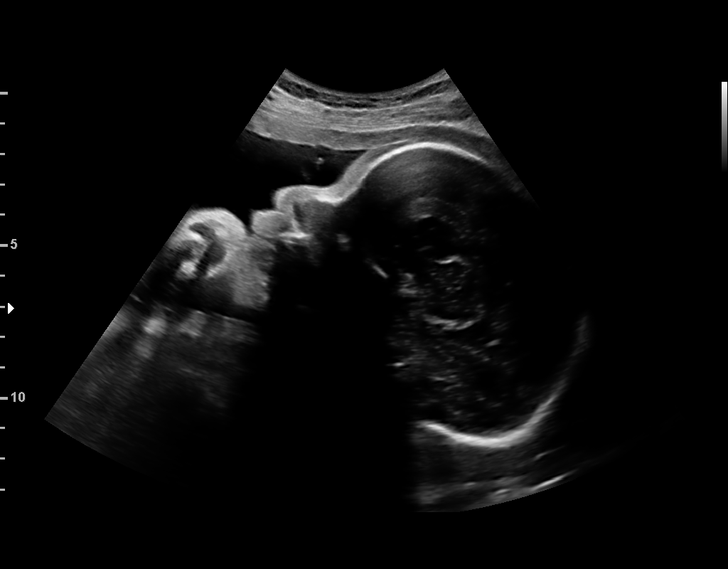
[im 6/28]
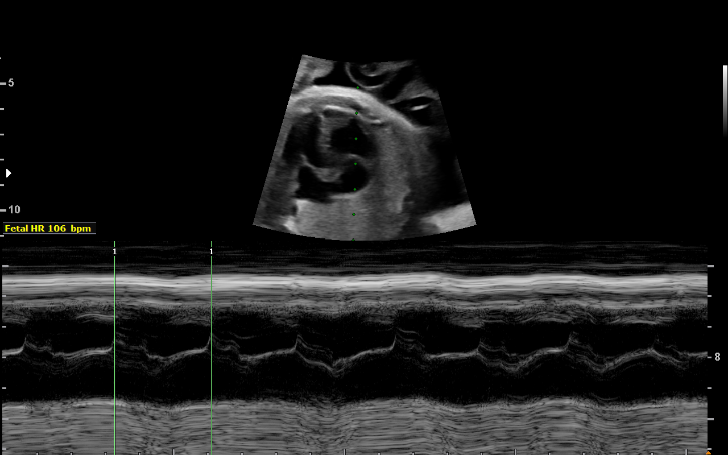
[im 8/28]
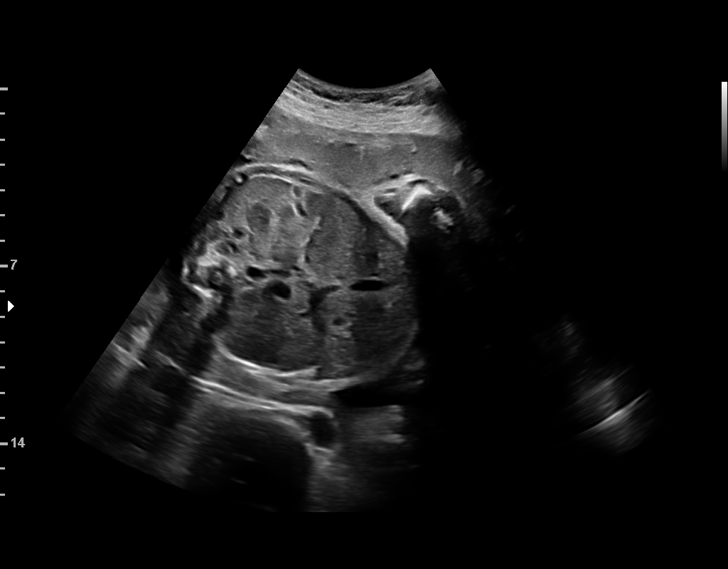
[im 10/28]
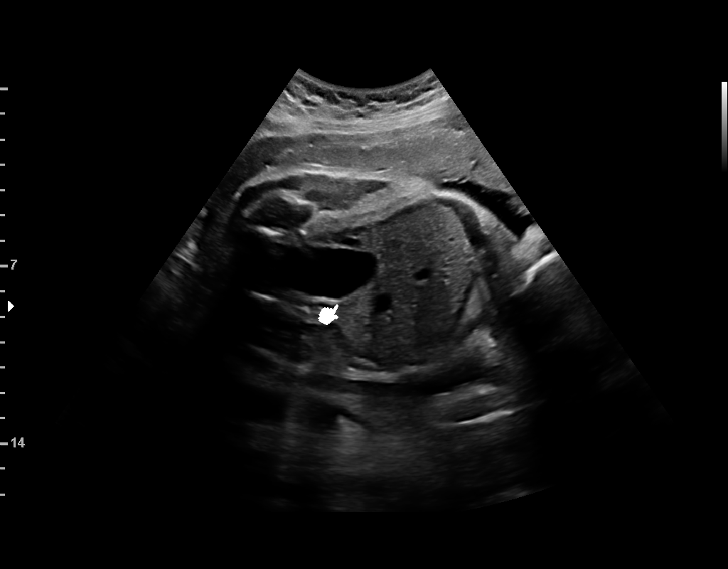
[im 12/28]
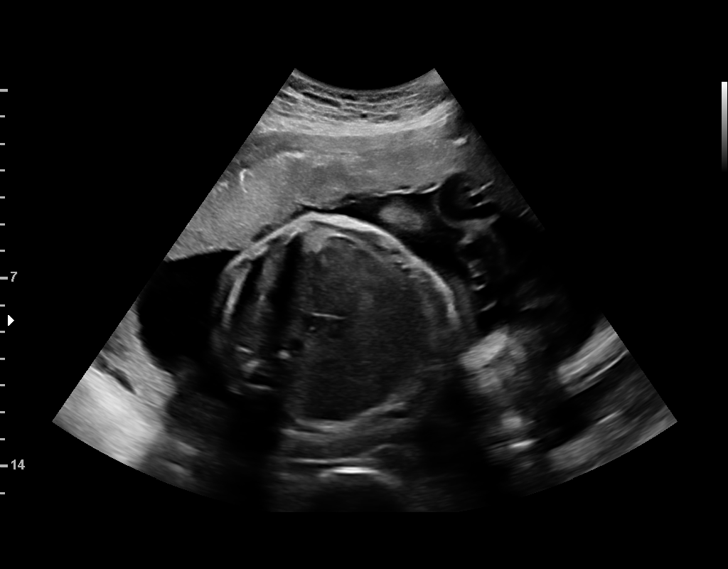
[im 15/28]
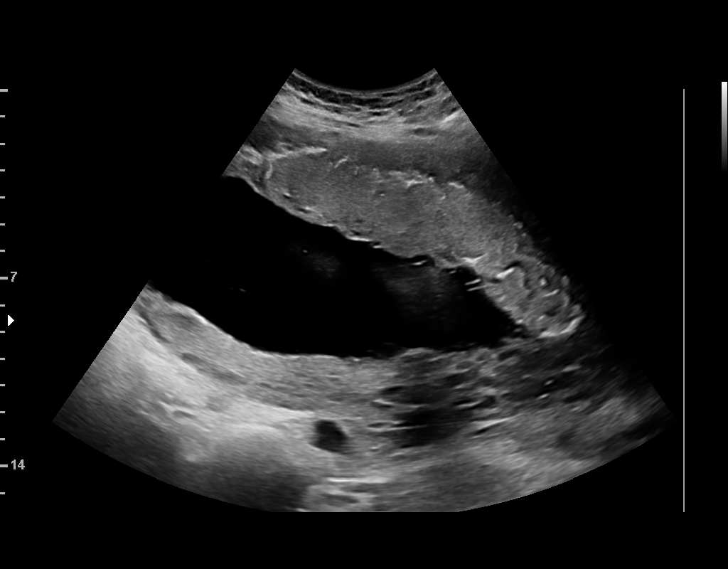
[im 17/28]
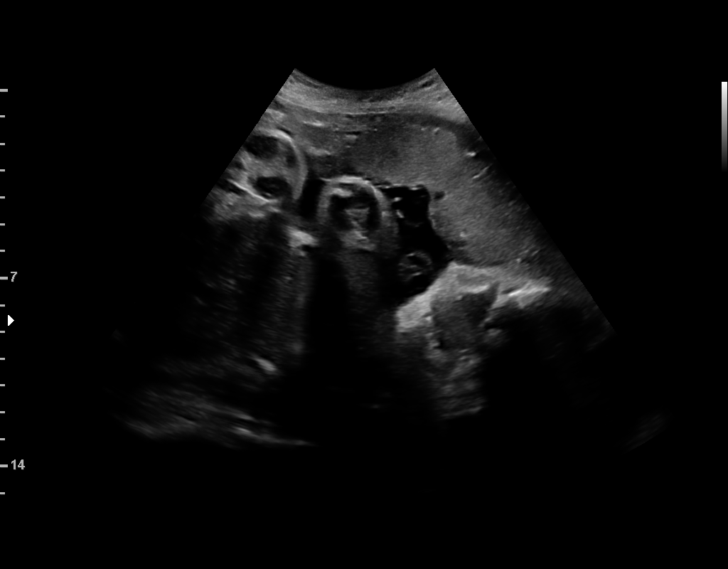
[im 19/28]
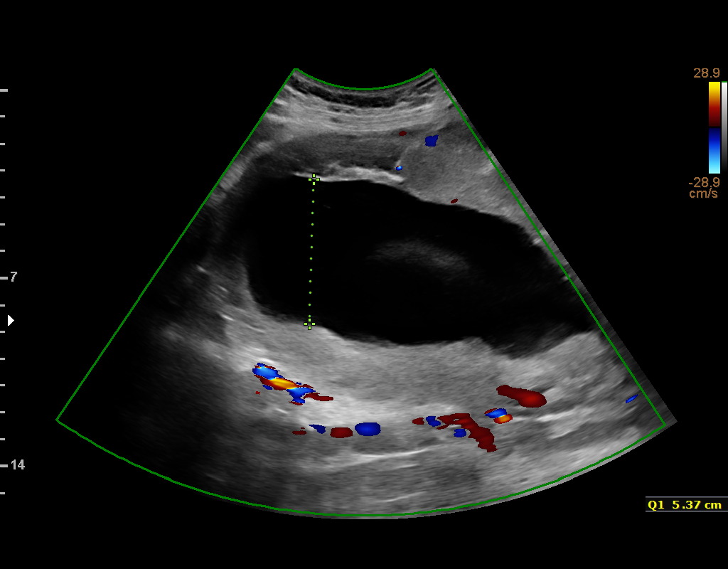
[im 21/28]
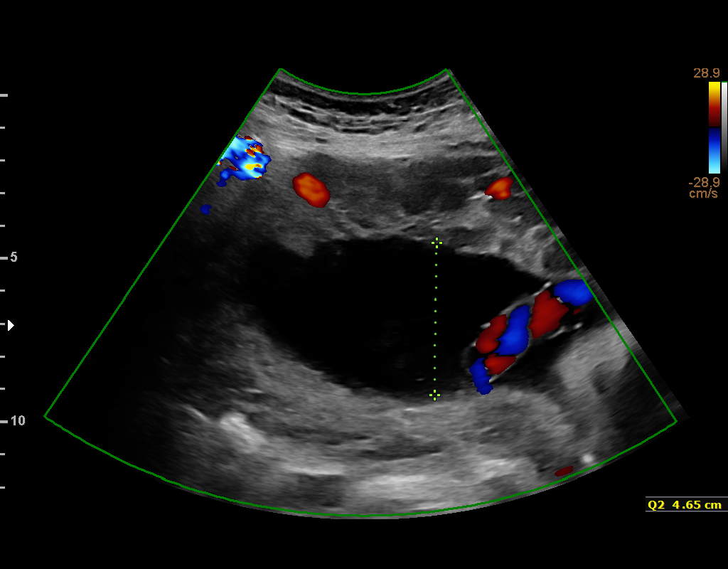
[im 23/28]
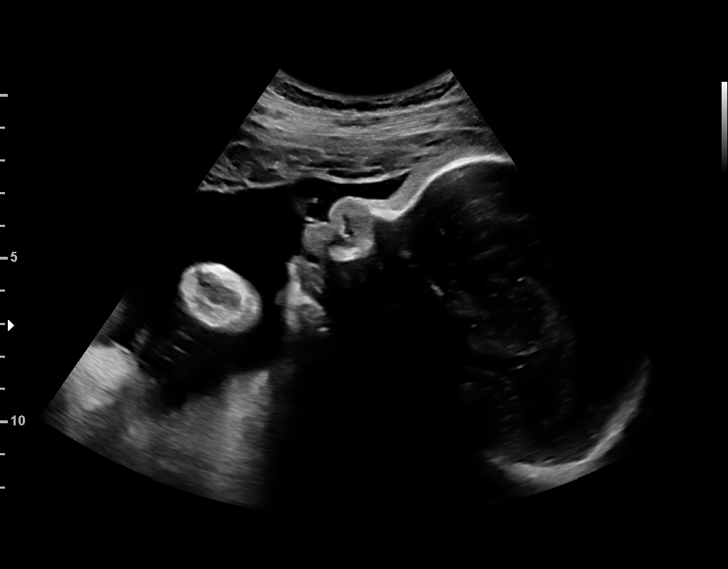
[im 25/28]
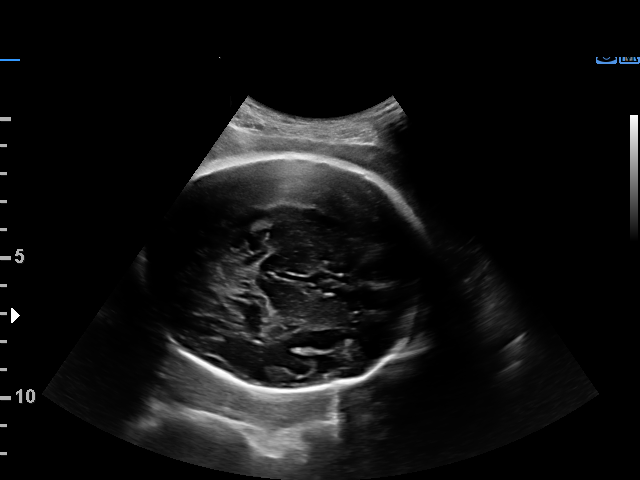
[im 27/28]
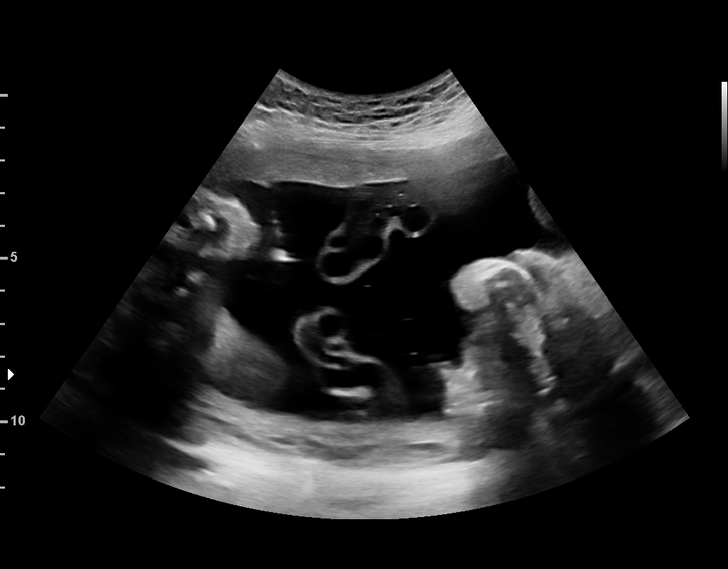

[13 of 28 positions shown; findings below may reference images not displayed]

[facility] [HOSPITAL]
Attending:        PLATRE        Location:         [HOSPITAL]

Indications

Fetal abnormality - other known or             [W7]
suspected (? agenesis of corpus collosum)
33 weeks gestation of pregnancy
Advanced maternal age multigravida 35+,        [W7]
third trimester (low risk NIPS)
Vital Signs

BMI:         26.63        Pulse:  89
BP:          105/64
Fetal Evaluation

Num Of Fetuses:         1
Fetal Heart Rate(bpm):  119
Cardiac Activity:       Observed
Presentation:           Cephalic
Placenta:               Anterior

Amniotic Fluid
AFI FV:      Within normal limits

AFI Sum(cm)     %Tile       Largest Pocket(cm)
14.28           50

RUQ(cm)       RLQ(cm)       LUQ(cm)        LLQ(cm)
5.37
Biophysical Evaluation

Amniotic F.V:   Within normal limits       F. Tone:        Observed
F. Movement:    Observed                   N.S.T:          Nonreactive
F. Breathing:   Observed                   Score:          [DATE]
OB History

Gravidity:    4         Term:   2
TOP:          1        Living:  2
Gestational Age

Best:          33w 0d     Det. By:  U/S  ([DATE])          EDD:   [DATE]
Cervix Uterus Adnexa

Cervix
Not visualized (advanced GA >[W7])
Comments

U/S images reviewed.
Procedure Note:
Under sterile conditions and [W7] U/S guidance, a
single amniocentesis (one stick) was performed without
incident.  Amniotic fluid was clear.  The first 3 cc and syringe
were discarded.  40 cc clear AF were sent for karyotype and
microarray.  FH was stable before (reactive NST) during and
after procedure (non-reactive NST; BPP [DATE].).  Blood type:
O positive
Questions answered.
10 minutes spent face to face with patient.
Recommendations: 1) Serial U/S every 4 weeks for fetal
growth and I-C anatomy review 2) Weekly BPP beginning @
36 weeks 3) Fetal MRI is scheduled for [DATE]
Recommendations

1) Serial U/S every 4 weeks for fetal growth and I-C anatomy
review 2) Weekly BPP beginning @ 36 weeks

## 2017-11-11 NOTE — Procedures (Signed)
Gabrielle Smith 02-24-1980 [redacted]w[redacted]d  Fetus A Non-Stress Test Interpretation for 11/11/17  Indication: pre amniocentesis  Fetal Heart Rate A Mode: External Baseline Rate (A): 135 bpm Variability: Moderate Accelerations: 15 x 15 Decelerations: None Multiple birth?: No  Uterine Activity Mode: Toco Contraction Frequency (min): none noted  Interpretation (Fetal Testing) Nonstress Test Interpretation: Reactive Comments: FHR tracing rev'd by Dr. Perry Mount

## 2017-11-11 NOTE — Procedures (Signed)
Gabrielle Smith 1980-03-21 [redacted]w[redacted]d  Fetus A Non-Stress Test Interpretation for 11/11/17  Indication: Post amnio  Fetal Heart Rate A Mode: External Baseline Rate (A): 130 bpm Variability: Minimal, Moderate Accelerations: 10 x 10 Decelerations: Variable Multiple birth?: No  Uterine Activity Mode: Palpation, Toco Contraction Frequency (min): none noted  Interpretation (Fetal Testing) Nonstress Test Interpretation: Non-reactive Comments: FHR tracing rev'd by Dr. Perry Mount

## 2017-11-11 NOTE — Consult Note (Signed)
U/S images reviewed. Findings reviewed with patient.   Patient has no reliable dating criteria  so our EDC is based on today's U/S.  She states that she had an ultrasound at 19 weeks at [place] but we do not have access to these results.  EFW is at the 53%.  The cavum septum pellucidum is not seen and the anterior horns of the ventricular system are prominent.  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  No other fetal abnormalities are seen.   Agenesis of the Corpus Callosum (ACC) is a rare birth defect in which there is a complete or partial absence of the corpus callosum <[email or web address]_[email or web address]>. Agenesis of the corpus callosum occurs when the corpus callosum, the band of white matter connecting the two hemispheres of the brain, fails to develop normally, typically in utero. The development of the fibers which would otherwise form the corpus callosum become longitudinally oriented within each hemisphere and form structures called Probst bundles <[email or web address]:\[email or web address].php?[email or web address]_bundle&action=edit&redlink=1>. Agenesis of the corpus callosum is caused by disruption to development of the fetal brain between the 3rd and 12th week of pregnancy.   In most cases, it is not possible to know what caused an individual to have ACC or another callosal disorder. However, research suggests that some possible causes may include chromosome errors, inherited genetic factors, prenatal infections or injuries, prenatal toxic exposures, structural blockage by cysts or other brain abnormalities, and metabolic disorders ACC is frequently associated with other anomalies of the CNS and other organs, including holoprosencephaly, Dandy-Walker Malformation, microcephaly, macrocephaly, median cleft syndrome and cardiovascular, GI and GU anomalies.  Prognosis depends on the extent and severity of malformations. Intellectual impairment does not worsen. Individuals with a disorder of the  corpus callosum typically have delays in attaining developmental milestones such as walking, talking, or reading; challenges with social interactions; clumsiness and poor motor coordination, particularly on skills that require coordination of left and right hands and feet (such as swimming, bicycle riding, and driving; and mental and social processing problems that become more apparent with age, with problems particularly evident from junior high school into adulthood.  Patient declined amniocentesis but would like to have a fetal MRI.  Questions answered. 15 minutes spent face to face with patient. Recommendations: 1) Fetal MRI; F/U 1 week after fetal MRI to discuss results 2) Serial U/S every 4 weeks for fetal growth 3) Weekly BPP beginning @ 36 weeks

## 2017-11-19 ENCOUNTER — Telehealth (HOSPITAL_COMMUNITY): Payer: Self-pay | Admitting: *Deleted

## 2017-11-19 ENCOUNTER — Other Ambulatory Visit (HOSPITAL_COMMUNITY): Payer: Self-pay

## 2017-11-20 ENCOUNTER — Other Ambulatory Visit (HOSPITAL_COMMUNITY): Payer: Self-pay

## 2017-11-21 ENCOUNTER — Encounter: Payer: Self-pay | Admitting: Obstetrics & Gynecology

## 2017-11-26 ENCOUNTER — Encounter (HOSPITAL_COMMUNITY): Payer: Self-pay

## 2017-11-26 ENCOUNTER — Inpatient Hospital Stay (HOSPITAL_COMMUNITY)
Admission: AD | Admit: 2017-11-26 | Discharge: 2017-11-26 | Disposition: A | Discharge status: Home / Self Care | Payer: Medicaid Other | Source: Ambulatory Visit | Attending: Family Medicine | Admitting: Family Medicine

## 2017-11-26 DIAGNOSIS — O4703 False labor before 37 completed weeks of gestation, third trimester: Secondary | ICD-10-CM

## 2017-11-26 DIAGNOSIS — Z3A35 35 weeks gestation of pregnancy: Secondary | ICD-10-CM | POA: Diagnosis not present

## 2017-11-26 LAB — URINALYSIS, ROUTINE W REFLEX MICROSCOPIC
Bilirubin Urine: NEGATIVE
GLUCOSE, UA: NEGATIVE mg/dL
Hgb urine dipstick: NEGATIVE
Ketones, ur: NEGATIVE mg/dL
LEUKOCYTES UA: NEGATIVE
Nitrite: NEGATIVE
PH: 6 (ref 5.0–8.0)
Protein, ur: NEGATIVE mg/dL
Specific Gravity, Urine: 1.01 (ref 1.005–1.030)

## 2017-11-26 NOTE — Discharge Instructions (Signed)
Braxton Hicks Contractions °Contractions of the uterus can occur throughout pregnancy, but they are not always a sign that you are in labor. You may have practice contractions called Braxton Hicks contractions. These false labor contractions are sometimes confused with true labor. °What are Braxton Hicks contractions? °Braxton Hicks contractions are tightening movements that occur in the muscles of the uterus before labor. Unlike true labor contractions, these contractions do not result in opening (dilation) and thinning of the cervix. Toward the end of pregnancy (32-34 weeks), Braxton Hicks contractions can happen more often and may become stronger. These contractions are sometimes difficult to tell apart from true labor because they can be very uncomfortable. You should not feel embarrassed if you go to the hospital with false labor. °Sometimes, the only way to tell if you are in true labor is for your health care provider to look for changes in the cervix. The health care provider will do a physical exam and may monitor your contractions. If you are not in true labor, the exam should show that your cervix is not dilating and your water has not broken. °If there are other health problems associated with your pregnancy, it is completely safe for you to be sent home with false labor. You may continue to have Braxton Hicks contractions until you go into true labor. °How to tell the difference between true labor and false labor °True labor °· Contractions last 30-70 seconds. °· Contractions become very regular. °· Discomfort is usually felt in the top of the uterus, and it spreads to the lower abdomen and low back. °· Contractions do not go away with walking. °· Contractions usually become more intense and increase in frequency. °· The cervix dilates and gets thinner. °False labor °· Contractions are usually shorter and not as strong as true labor contractions. °· Contractions are usually irregular. °· Contractions  are often felt in the front of the lower abdomen and in the groin. °· Contractions may go away when you walk around or change positions while lying down. °· Contractions get weaker and are shorter-lasting as time goes on. °· The cervix usually does not dilate or become thin. °Follow these instructions at home: °· Take over-the-counter and prescription medicines only as told by your health care provider. °· Keep up with your usual exercises and follow other instructions from your health care provider. °· Eat and drink lightly if you think you are going into labor. °· If Braxton Hicks contractions are making you uncomfortable: °? Change your position from lying down or resting to walking, or change from walking to resting. °? Sit and rest in a tub of warm water. °? Drink enough fluid to keep your urine pale yellow. Dehydration may cause these contractions. °? Do slow and deep breathing several times an hour. °· Keep all follow-up prenatal visits as told by your health care provider. This is important. °Contact a health care provider if: °· You have a fever. °· You have continuous pain in your abdomen. °Get help right away if: °· Your contractions become stronger, more regular, and closer together. °· You have fluid leaking or gushing from your vagina. °· You pass blood-tinged mucus (bloody show). °· You have bleeding from your vagina. °· You have low back pain that you never had before. °· You feel your baby’s head pushing down and causing pelvic pressure. °· Your baby is not moving inside you as much as it used to. °Summary °· Contractions that occur before labor are called Braxton   Hicks contractions, false labor, or practice contractions. °· Braxton Hicks contractions are usually shorter, weaker, farther apart, and less regular than true labor contractions. True labor contractions usually become progressively stronger and regular and they become more frequent. °· Manage discomfort from Braxton Hicks contractions by  changing position, resting in a warm bath, drinking plenty of water, or practicing deep breathing. °This information is not intended to replace advice given to you by your health care provider. Make sure you discuss any questions you have with your health care provider. °Document Released: 06/13/2016 Document Revised: 06/13/2016 Document Reviewed: 06/13/2016 °Elsevier Interactive Patient Education © 2018 [employer] Inc. ° °

## 2017-11-26 NOTE — MAU Provider Note (Signed)
History     CSN: [id number]  Arrival date and time: 11/26/17 1901   First Provider Initiated Contact with Patient 11/26/17 1943      Chief Complaint  Patient presents with  . Contractions   HPI This is a 37 year old G4 P2-0-1-2 at 35 weeks and 1 day.  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  Patient is being followed by MFM.  She reports cramping that started 3 days ago.  Initially was intermittent and now has become fairly regular.  Her cramping used to improve with lying down, but is now continuing despite that.  Denies vaginal bleeding, vaginal discharge, nausea, vomiting, decreased fetal movement, leaking fluid.   OB History    Gravida  4   Para  2   Term  2   Preterm  0   AB  1   Living  2     SAB      TAB  1   Ectopic      Multiple      Live Births  2           Past Medical History:  Diagnosis Date  . 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 HSV infection   . Peripheral vascular disease (HCC)   . Vaginal Pap smear, abnormal     Past Surgical History:  Procedure Laterality Date  . APPENDECTOMY    . BREAST FIBROADENOMA SURGERY      No family history on file.  Social History   Tobacco Use  . Smoking status: Current Every Day Smoker    Packs/day: 0.10    Types: Cigarettes    Last attempt to quit: 08/27/2017    Years since quitting: 0.2  . Smokeless tobacco: Never Used  Substance Use Topics  . Alcohol use: Never    Frequency: Never  . Drug use: Never    Allergies: No Known Allergies  Medications Prior to Admission  Medication Sig Dispense Refill Last Dose  . cyclobenzaprine (FLEXERIL) 10 MG tablet Take 1 tablet (10 mg total) by mouth 3 (three) times daily as needed for muscle spasms. 30 tablet 0 11/26/2017 at Unknown time  . Omeprazole 20 MG TBEC Take 1 tablet (20 mg total) by mouth daily. 30 each 2 11/26/2017 at Unknown time  . Prenatal Multivit-Min-Fe-FA (PRENATAL VITAMINS) 0.8 MG tablet Take 1  tablet by mouth daily. 30 tablet 12 11/26/2017 at Unknown time  . valACYclovir (VALTREX) 500 MG tablet Take 1 tablet (500 mg total) by mouth 2 (two) times daily. 60 tablet 0 11/26/2017 at Unknown time  . Elastic Bandages & Supports (COMFORT FIT MATERNITY SUPP MED) MISC 1 Device by Does not apply route daily. 1 each 0 Taking  . traMADol (ULTRAM) 50 MG tablet Take 1 tablet (50 mg total) by mouth every 6 (six) hours as needed. 20 tablet 0 Taking    Review of Systems  All other systems reviewed and are negative.  Physical Exam   Blood pressure 122/77, pulse 85, temperature 98.5 F (36.9 C), resp. rate 20, height 5\' 7"  (1.702 m), weight 76.4 kg, last menstrual period 04/04/2017, SpO2 100 %.  Physical Exam  Constitutional: She is oriented to person, place, and time. She appears well-developed and well-nourished.  HENT:  Head: Normocephalic and atraumatic.  Right Ear: External ear normal.  Left Ear: External ear normal.  Cardiovascular: Normal rate, regular rhythm and normal heart sounds.  Respiratory: Effort normal and breath sounds normal.  GI: Soft. Bowel sounds are  normal. She exhibits no distension. There is no tenderness. There is no rebound and no guarding.  Neurological: She is alert and oriented to person, place, and time.  Skin: Skin is warm and dry.  Psychiatric: She has a normal mood and affect. Her behavior is normal. Judgment and thought content normal.   Dilation: 1 Effacement (%): Thick Cervical Position: Posterior Station: -3 Exam by:: Dr. Adrian Blackwater   MAU Course  Procedures  MDM NST reactive: FHT 130s-140s. Mod variability, +accel. No decel.  Assessment and Plan     ICD-10-CM   1. Preterm uterine contractions in third trimester, antepartum O47.03   2. [redacted] weeks gestation of pregnancy Z3A.35    Contractions improved with oral fluids.  D/c to home.  Gabrielle Smith 11/26/2017, 7:44 PM

## 2017-11-26 NOTE — MAU Note (Signed)
Reports a sharp pain every 10-15 minutes for the past 2 days, usually goes away when she lies down-this time did not. No LOF/VB.  +FM.  PNC downstairs

## 2017-11-28 ENCOUNTER — Encounter: Payer: Self-pay | Admitting: Student

## 2017-11-28 ENCOUNTER — Encounter: Payer: Self-pay | Admitting: Obstetrics & Gynecology

## 2017-12-04 ENCOUNTER — Ambulatory Visit (HOSPITAL_COMMUNITY)
Admission: RE | Admit: 2017-12-04 | Discharge: 2017-12-04 | Disposition: A | Discharge status: Home / Self Care | Payer: Medicaid Other | Source: Ambulatory Visit | Attending: Student | Admitting: Student

## 2017-12-04 ENCOUNTER — Encounter (HOSPITAL_COMMUNITY): Payer: Self-pay

## 2017-12-05 ENCOUNTER — Encounter: Payer: Self-pay | Admitting: Student

## 2017-12-07 ENCOUNTER — Inpatient Hospital Stay (HOSPITAL_BASED_OUTPATIENT_CLINIC_OR_DEPARTMENT_OTHER): Payer: Medicaid Other

## 2017-12-07 ENCOUNTER — Inpatient Hospital Stay (HOSPITAL_COMMUNITY)
Admission: AD | Admit: 2017-12-07 | Discharge: 2017-12-07 | Disposition: A | Discharge status: Home / Self Care | Payer: Medicaid Other | Source: Ambulatory Visit | Attending: Obstetrics & Gynecology | Admitting: Obstetrics & Gynecology

## 2017-12-07 DIAGNOSIS — O99353 Diseases of the nervous system complicating pregnancy, third trimester: Secondary | ICD-10-CM | POA: Insufficient documentation

## 2017-12-07 DIAGNOSIS — I739 Peripheral vascular disease, unspecified: Secondary | ICD-10-CM | POA: Insufficient documentation

## 2017-12-07 DIAGNOSIS — Q040 Congenital malformations of corpus callosum: Secondary | ICD-10-CM | POA: Insufficient documentation

## 2017-12-07 DIAGNOSIS — Z3A36 36 weeks gestation of pregnancy: Secondary | ICD-10-CM | POA: Diagnosis not present

## 2017-12-07 DIAGNOSIS — M79673 Pain in unspecified foot: Secondary | ICD-10-CM | POA: Diagnosis not present

## 2017-12-07 DIAGNOSIS — Z79899 Other long term (current) drug therapy: Secondary | ICD-10-CM | POA: Insufficient documentation

## 2017-12-07 DIAGNOSIS — G8929 Other chronic pain: Secondary | ICD-10-CM | POA: Insufficient documentation

## 2017-12-07 DIAGNOSIS — O359XX1 Maternal care for (suspected) fetal abnormality and damage, unspecified, fetus 1: Secondary | ICD-10-CM | POA: Diagnosis not present

## 2017-12-07 DIAGNOSIS — O359XX0 Maternal care for (suspected) fetal abnormality and damage, unspecified, not applicable or unspecified: Secondary | ICD-10-CM | POA: Diagnosis not present

## 2017-12-07 DIAGNOSIS — O479 False labor, unspecified: Secondary | ICD-10-CM

## 2017-12-07 DIAGNOSIS — O4703 False labor before 37 completed weeks of gestation, third trimester: Secondary | ICD-10-CM | POA: Diagnosis not present

## 2017-12-07 DIAGNOSIS — Q279 Congenital malformation of peripheral vascular system, unspecified: Secondary | ICD-10-CM

## 2017-12-07 MED ORDER — TRAMADOL HCL 50 MG PO TABS: 50.0000 mg | ORAL_TABLET | Freq: Four times a day (QID) | ORAL | 0 refills | Status: AC | PRN

## 2017-12-07 NOTE — MAU Provider Note (Signed)
CC:  Chief Complaint  Patient presents with  . Contractions       First Provider Initiated Contact with Patient 12/07/17 1917     HPI: Gabrielle Smith is a 37 y.o. year old [id number] female at [redacted]w[redacted]d weeks gestation who presents to MAU reporting contractions. Has missed last few appointments including BPP. Was Dx w/ possible fetal agenesis of corpus collosum w/ plan to start antenatal testing at 36 weeks, but has not done so. States she had a lot going on. Lives at [facility] [place]. Has complex psychosocial situation. CNM is concerned about whether she will follow-up for BPP in the office. Requesting Tramadol refill for exacerbation of chronic foot pain due to injury. Last Rx 10/21/17. #20.   Associated Sx:  Vaginal bleeding: Denies Leaking of fluid: Denies Fetal movement: Nml  Patient Active Problem List   Diagnosis Date Noted  . 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 Supervision of other normal pregnancy, antepartum 10/07/2017  . High risk social situation 10/07/2017  . Congenital vascular malformation 10/07/2017  . Peripheral vascular disease of foot (HCC) 10/07/2017     O:  Patient Vitals for the past 24 hrs:  BP Temp Temp src Pulse Resp SpO2 Weight  12/07/17 1303 105/65 97.9 F (36.6 C) Oral 94 18 100 % -  12/07/17 1254 - - - - - - 73.1 kg    General: NAD Heart: Regular rate Lungs: Normal rate and effort Abd: Soft, NT, Gravid, S=D Pelvic: Per RN Dilation: 1.5 Effacement (%): Thick Cervical Position: Posterior Station: -3 Presentation: Undeterminable Exam by:: Marvel Plan RN  EFM: 120, Moderate variability, 15 x 15 accelerations, no decelerations Toco: Irreg, w/ UI  BPP 10/10  Orders Placed This Encounter  Procedures  . [place] MFM Fetal BPP Wo Non Stress  . Discharge patient   Meds ordered this encounter  Medications  . traMADol (ULTRAM) 50 MG tablet    Sig: Take 1 tablet (50 mg total) by mouth every 6 (six) hours as needed.    Dispense:  20 tablet    Refill:  0    Order Specific Question:   Supervising Provider    Answer:   Duane Lope H [[id number]]    A: [redacted]w[redacted]d week IUP Braxton Hicks BPP 10/10 FHR reactive Acute exacerbation of chronic pain.   P: Discharge home in stable condition Labor labor precautions and fetal kick counts. Encouraged to keep appointments Follow-up Information    [facility] Follow up on 12/12/2017.   Specialty:  Obstetrics and Gynecology Why:  As scheduled for return OB visit Contact information: [address] [place] [address] [phone]       [facility] Follow up.   Why:  as needed if symptoms worsen Contact information: [address] [id number] mc [place] [address] [phone]         Allergies as of 12/07/2017   No Known Allergies     Medication List    TAKE these medications   COMFORT FIT MATERNITY SUPP MED Misc 1 Device by Does not apply route daily.   cyclobenzaprine 10 MG tablet Commonly known as:  FLEXERIL Take 1 tablet (10 mg total) by mouth 3 (three) times daily as needed for muscle spasms.   Omeprazole 20 MG Tbec Take 1 tablet (20 mg total) by mouth daily.   Prenatal Vitamins 0.8 MG tablet Take 1 tablet by mouth daily.   traMADol 50 MG tablet Commonly known as:  Janean Sark  Take 1 tablet (50 mg total) by mouth every 6 (six) hours as needed.   valACYclovir 500 MG tablet Commonly known as:  VALTREX Take 1 tablet (500 mg total) by mouth 2 (two) times daily.      Follow-up as scheduled for prenatal visit or sooner as needed if symptoms worsen. Return to maternity admissions as needed if symptoms worsen.  Katrinka Blazing, [place], CNM 12/07/2017 4:46 PM  3

## 2017-12-07 NOTE — Discharge Instructions (Signed)

## 2017-12-07 NOTE — MAU Note (Signed)
Gabrielle Smith is a 37 y.o. at [redacted]w[redacted]d here in MAU reporting: +contractions. Ongoing since last week. Denies LOF or VB. Pain score: 7/10 Vitals:   12/07/17 1303  BP: 105/65  Pulse: 94  Resp: 18  Temp: 97.9 F (36.6 C)  SpO2: 100%

## 2017-12-12 ENCOUNTER — Ambulatory Visit (INDEPENDENT_AMBULATORY_CARE_PROVIDER_SITE_OTHER): Payer: Medicaid Other | Admitting: Student

## 2017-12-12 ENCOUNTER — Other Ambulatory Visit (HOSPITAL_COMMUNITY)
Admission: RE | Admit: 2017-12-12 | Discharge: 2017-12-12 | Disposition: A | Discharge status: Home / Self Care | Payer: Medicaid Other | Source: Ambulatory Visit | Attending: Student | Admitting: Student

## 2017-12-12 ENCOUNTER — Telehealth (HOSPITAL_COMMUNITY): Payer: Self-pay | Admitting: *Deleted

## 2017-12-12 ENCOUNTER — Ambulatory Visit (INDEPENDENT_AMBULATORY_CARE_PROVIDER_SITE_OTHER): Payer: Medicaid Other | Admitting: Clinical

## 2017-12-12 VITALS — BP 117/76 | HR 97

## 2017-12-12 DIAGNOSIS — Z3480 Encounter for supervision of other normal pregnancy, unspecified trimester: Secondary | ICD-10-CM | POA: Insufficient documentation

## 2017-12-12 DIAGNOSIS — O98513 Other viral diseases complicating pregnancy, third trimester: Secondary | ICD-10-CM | POA: Diagnosis not present

## 2017-12-12 DIAGNOSIS — B009 Herpesviral infection, unspecified: Secondary | ICD-10-CM

## 2017-12-12 DIAGNOSIS — Z658 Other specified problems related to psychosocial circumstances: Secondary | ICD-10-CM | POA: Diagnosis not present

## 2017-12-12 MED ORDER — VALACYCLOVIR HCL 500 MG PO TABS: 500.0000 mg | ORAL_TABLET | Freq: Two times a day (BID) | ORAL | 6 refills | Status: AC

## 2017-12-12 NOTE — Patient Instructions (Signed)
Braxton Hicks Contractions °Contractions of the uterus can occur throughout pregnancy, but they are not always a sign that you are in labor. You may have practice contractions called Braxton Hicks contractions. These false labor contractions are sometimes confused with true labor. °What are Braxton Hicks contractions? °Braxton Hicks contractions are tightening movements that occur in the muscles of the uterus before labor. Unlike true labor contractions, these contractions do not result in opening (dilation) and thinning of the cervix. Toward the end of pregnancy (32-34 weeks), Braxton Hicks contractions can happen more often and may become stronger. These contractions are sometimes difficult to tell apart from true labor because they can be very uncomfortable. You should not feel embarrassed if you go to the hospital with false labor. °Sometimes, the only way to tell if you are in true labor is for your health care provider to look for changes in the cervix. The health care provider will do a physical exam and may monitor your contractions. If you are not in true labor, the exam should show that your cervix is not dilating and your water has not broken. °If there are other health problems associated with your pregnancy, it is completely safe for you to be sent home with false labor. You may continue to have Braxton Hicks contractions until you go into true labor. °How to tell the difference between true labor and false labor °True labor °· Contractions last 30-70 seconds. °· Contractions become very regular. °· Discomfort is usually felt in the top of the uterus, and it spreads to the lower abdomen and low back. °· Contractions do not go away with walking. °· Contractions usually become more intense and increase in frequency. °· The cervix dilates and gets thinner. °False labor °· Contractions are usually shorter and not as strong as true labor contractions. °· Contractions are usually irregular. °· Contractions  are often felt in the front of the lower abdomen and in the groin. °· Contractions may go away when you walk around or change positions while lying down. °· Contractions get weaker and are shorter-lasting as time goes on. °· The cervix usually does not dilate or become thin. °Follow these instructions at home: °· Take over-the-counter and prescription medicines only as told by your health care provider. °· Keep up with your usual exercises and follow other instructions from your health care provider. °· Eat and drink lightly if you think you are going into labor. °· If Braxton Hicks contractions are making you uncomfortable: °? Change your position from lying down or resting to walking, or change from walking to resting. °? Sit and rest in a tub of warm water. °? Drink enough fluid to keep your urine pale yellow. Dehydration may cause these contractions. °? Do slow and deep breathing several times an hour. °· Keep all follow-up prenatal visits as told by your health care provider. This is important. °Contact a health care provider if: °· You have a fever. °· You have continuous pain in your abdomen. °Get help right away if: °· Your contractions become stronger, more regular, and closer together. °· You have fluid leaking or gushing from your vagina. °· You pass blood-tinged mucus (bloody show). °· You have bleeding from your vagina. °· You have low back pain that you never had before. °· You feel your baby’s head pushing down and causing pelvic pressure. °· Your baby is not moving inside you as much as it used to. °Summary °· Contractions that occur before labor are called Braxton   Hicks contractions, false labor, or practice contractions. °· Braxton Hicks contractions are usually shorter, weaker, farther apart, and less regular than true labor contractions. True labor contractions usually become progressively stronger and regular and they become more frequent. °· Manage discomfort from Braxton Hicks contractions by  changing position, resting in a warm bath, drinking plenty of water, or practicing deep breathing. °This information is not intended to replace advice given to you by your health care provider. Make sure you discuss any questions you have with your health care provider. °Document Released: 06/13/2016 Document Revised: 06/13/2016 Document Reviewed: 06/13/2016 °Elsevier Interactive Patient Education © 2018 [employer] Inc[employer] ° °
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
## 2017-12-12 NOTE — Progress Notes (Signed)
   PRENATAL VISIT NOTE  Subjective:  Gabrielle Smith is a 37 y.o. [phone] at [redacted]w[redacted]d being seen today for ongoing prenatal care.  She is currently monitored for the following issues for this high-risk pregnancy and has Supervision of other normal pregnancy, antepartum; High risk social situation; Congenital vascular malformation; Peripheral vascular disease of foot (HCC); and Agenesis of corpus callosum (HCC) on their problem list.  Patient reports occasional contractions.  Contractions: Irregular. Vag. Bleeding: None.  Movement: Present. Denies leaking of fluid.   The following portions of the patient's history were reviewed and updated as appropriate: allergies, current medications, past family history, past medical history, past social history, past surgical history and problem list. Problem list updated.  Objective:   Vitals:   12/12/17 1023  BP: 117/76  Pulse: 97    Fetal Status: Fetal Heart Rate (bpm): 140 Fundal Height: 38 cm Movement: Present  Presentation: Vertex  General:  Alert, oriented and cooperative. Patient is in no acute distress.  Skin: Skin is warm and dry. No rash noted.   Cardiovascular: Normal heart rate noted  Respiratory: Normal respiratory effort, no problems with respiration noted  Abdomen: Soft, gravid, appropriate for gestational age.  Pain/Pressure: Present     Pelvic: Cervical exam performed Dilation: 1.5 Effacement (%): Thick Station: -3  Extremities: Normal range of motion.  Edema: None  Mental Status: Normal mood and affect. Normal behavior. Normal judgment and thought content.   Assessment and Plan:  Pregnancy: [id number] at [redacted]w[redacted]d  1. Supervision of other normal pregnancy, antepartum -Pt had no showed 4 ob appointments in the last month. Per MFM recommendation she should've started weekly BPPs & Q4 wk growth ultrasounds d/t fetal agenesis of corpus collosum. Had BPP when seen in MAU this week. Scheduled for BPP & growth ultrasound with MFM next week.  -  GC/Chlamydia probe amp ([place])not at [facility] - Culture, beta strep (group b only)  2. HSV-2 infection complicating pregnancy, third trimester -no current outbreak - valACYclovir (VALTREX) 500 MG tablet; Take 1 tablet (500 mg total) by mouth 2 (two) times daily.  Dispense: 60 tablet; Refill: 6  Term labor symptoms and general obstetric precautions including but not limited to vaginal bleeding, contractions, leaking of fluid and fetal movement were reviewed in detail with the patient. Please refer to After Visit Summary for other counseling recommendations.  Return in about 1 week (around 12/19/2017) for High Risk OB w/md.  Future Appointments  Date Time Provider Department Center  12/19/2017  4:00 PM [facility] [place] 3 [facility]  12/23/2017  3:55 PM Katrinka Blazing, [place], CNM [facility]    Judeth Horn, NP

## 2017-12-12 NOTE — Telephone Encounter (Signed)
Pt came by [facility] for results from amniocentesis.  Name and DOB verified.  Normal results given to [facility].  Pt requested that I share this information with the adoptive mother.  Result given.

## 2017-12-15 ENCOUNTER — Encounter: Payer: Self-pay | Admitting: Student

## 2017-12-15 DIAGNOSIS — B951 Streptococcus, group B, as the cause of diseases classified elsewhere: Secondary | ICD-10-CM | POA: Insufficient documentation

## 2017-12-15 LAB — GC/CHLAMYDIA PROBE AMP (~~LOC~~) NOT AT ARMC
CHLAMYDIA, DNA PROBE: NEGATIVE
Neisseria Gonorrhea: NEGATIVE

## 2017-12-15 LAB — CULTURE, BETA STREP (GROUP B ONLY): STREP GP B CULTURE: POSITIVE — AB

## 2017-12-19 ENCOUNTER — Ambulatory Visit (HOSPITAL_COMMUNITY): Payer: Self-pay

## 2017-12-23 ENCOUNTER — Encounter: Payer: Self-pay | Admitting: Advanced Practice Midwife

## 2018-01-02 ENCOUNTER — Telehealth (HOSPITAL_COMMUNITY): Payer: Self-pay | Admitting: *Deleted

## 2018-03-24 ENCOUNTER — Encounter (HOSPITAL_COMMUNITY): Payer: Self-pay

## 2018-05-07 NOTE — Telephone Encounter (Signed)
Error

## 2018-06-27 ENCOUNTER — Emergency Department (HOSPITAL_COMMUNITY)
Admission: EM | Admit: 2018-06-27 | Discharge: 2018-06-27 | Disposition: A | Discharge status: Home / Self Care | Payer: Medicaid Other | Attending: Emergency Medicine | Admitting: Emergency Medicine

## 2018-06-27 ENCOUNTER — Other Ambulatory Visit: Payer: Self-pay

## 2018-06-27 ENCOUNTER — Emergency Department (HOSPITAL_COMMUNITY): Payer: Medicaid Other

## 2018-06-27 ENCOUNTER — Encounter (HOSPITAL_COMMUNITY): Payer: Self-pay | Admitting: Emergency Medicine

## 2018-06-27 DIAGNOSIS — F17210 Nicotine dependence, cigarettes, uncomplicated: Secondary | ICD-10-CM | POA: Diagnosis not present

## 2018-06-27 DIAGNOSIS — Z5321 Procedure and treatment not carried out due to patient leaving prior to being seen by health care provider: Secondary | ICD-10-CM | POA: Insufficient documentation

## 2018-06-27 DIAGNOSIS — S2232XS Fracture of one rib, left side, sequela: Secondary | ICD-10-CM | POA: Diagnosis not present

## 2018-06-27 DIAGNOSIS — M546 Pain in thoracic spine: Secondary | ICD-10-CM | POA: Diagnosis present

## 2018-06-27 MED ORDER — HYDROCODONE-ACETAMINOPHEN 5-325 MG PO TABS
1.0000 | ORAL_TABLET | Freq: Once | ORAL | Status: AC
  Administered 2018-06-27: 16:00:00 1 via ORAL
  Filled 2018-06-27: qty 1

## 2018-06-27 NOTE — Discharge Instructions (Signed)
I have provided an inspirometer which you should use daily to help with your breathing and health of your lungs. I have also prescribed tylenol along with lidoderm patches to help with your pain. Please follow up with your primary care physician as needed.
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
## 2018-06-27 NOTE — ED Notes (Signed)
Pt insisted on leaving facility stating that her ride was here. She was informed that her assessment and care were not complete, and that she would be leaving AMA. She was strongly and repeatedly encouraged to stay for further assessment. Pt was adamant about leaving and left AMA.   Opportunity for questioning and answers were provided. Pt left w/o AMA signatures obtained.

## 2018-06-27 NOTE — ED Notes (Signed)
EDP at bedside  
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
